# Patient Record
Sex: Male | Born: 1990 | Race: White | Hispanic: No | Marital: Single | Smoking: Never smoker
Health system: Southern US, Community
[De-identification: ages and names within clinical notes are randomized; demographics above are authoritative.]

## PROBLEM LIST (undated history)

## (undated) HISTORY — PX: CHOLECYSTECTOMY: SHX55

## (undated) HISTORY — PX: GALLBLADDER SURGERY: SHX652

---
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
## 2008-04-19 ENCOUNTER — Emergency Department (HOSPITAL_BASED_OUTPATIENT_CLINIC_OR_DEPARTMENT_OTHER): Admission: EM | Admit: 2008-04-19 | Discharge: 2008-04-19 | Payer: Self-pay | Admitting: Emergency Medicine

## 2008-08-05 ENCOUNTER — Emergency Department (HOSPITAL_BASED_OUTPATIENT_CLINIC_OR_DEPARTMENT_OTHER): Admission: EM | Admit: 2008-08-05 | Discharge: 2008-08-05 | Payer: Self-pay | Admitting: Emergency Medicine

## 2008-08-07 ENCOUNTER — Emergency Department (HOSPITAL_BASED_OUTPATIENT_CLINIC_OR_DEPARTMENT_OTHER): Admission: EM | Admit: 2008-08-07 | Discharge: 2008-08-07 | Payer: Self-pay | Admitting: Emergency Medicine

## 2008-08-09 ENCOUNTER — Emergency Department (HOSPITAL_BASED_OUTPATIENT_CLINIC_OR_DEPARTMENT_OTHER): Admission: EM | Admit: 2008-08-09 | Discharge: 2008-08-09 | Payer: Self-pay | Admitting: Emergency Medicine

## 2008-09-08 ENCOUNTER — Emergency Department (HOSPITAL_BASED_OUTPATIENT_CLINIC_OR_DEPARTMENT_OTHER): Admission: EM | Admit: 2008-09-08 | Discharge: 2008-09-08 | Payer: Self-pay | Admitting: Emergency Medicine

## 2008-09-08 ENCOUNTER — Ambulatory Visit: Payer: Self-pay | Admitting: Diagnostic Radiology

## 2008-10-08 ENCOUNTER — Emergency Department (HOSPITAL_BASED_OUTPATIENT_CLINIC_OR_DEPARTMENT_OTHER): Admission: EM | Admit: 2008-10-08 | Discharge: 2008-10-08 | Payer: Self-pay | Admitting: Emergency Medicine

## 2008-10-28 ENCOUNTER — Emergency Department (HOSPITAL_BASED_OUTPATIENT_CLINIC_OR_DEPARTMENT_OTHER): Admission: EM | Admit: 2008-10-28 | Discharge: 2008-10-28 | Payer: Self-pay | Admitting: Emergency Medicine

## 2008-10-28 ENCOUNTER — Ambulatory Visit: Payer: Self-pay | Admitting: Occupational Medicine

## 2008-12-03 ENCOUNTER — Emergency Department (HOSPITAL_BASED_OUTPATIENT_CLINIC_OR_DEPARTMENT_OTHER): Admission: EM | Admit: 2008-12-03 | Discharge: 2008-12-03 | Payer: Self-pay | Admitting: Emergency Medicine

## 2008-12-03 ENCOUNTER — Ambulatory Visit: Payer: Self-pay | Admitting: Interventional Radiology

## 2009-04-28 ENCOUNTER — Emergency Department (HOSPITAL_BASED_OUTPATIENT_CLINIC_OR_DEPARTMENT_OTHER): Admission: EM | Admit: 2009-04-28 | Discharge: 2009-04-28 | Payer: Self-pay | Admitting: Emergency Medicine

## 2009-04-28 ENCOUNTER — Ambulatory Visit: Payer: Self-pay | Admitting: Interventional Radiology

## 2009-08-09 ENCOUNTER — Emergency Department (HOSPITAL_COMMUNITY): Admission: EM | Admit: 2009-08-09 | Discharge: 2009-08-09 | Payer: Self-pay | Admitting: Emergency Medicine

## 2010-05-03 IMAGING — CR DG CHEST 2V
2 series · 2 of 2 positions shown · non-contrast
Comparison: None.

CLINICAL DATA: Nausea and vomiting.  Abdominal pain.  Negative CT
abdomen pelvis earlier today.

CHEST - 2 VIEW 04/28/2009:

[w chest pa]
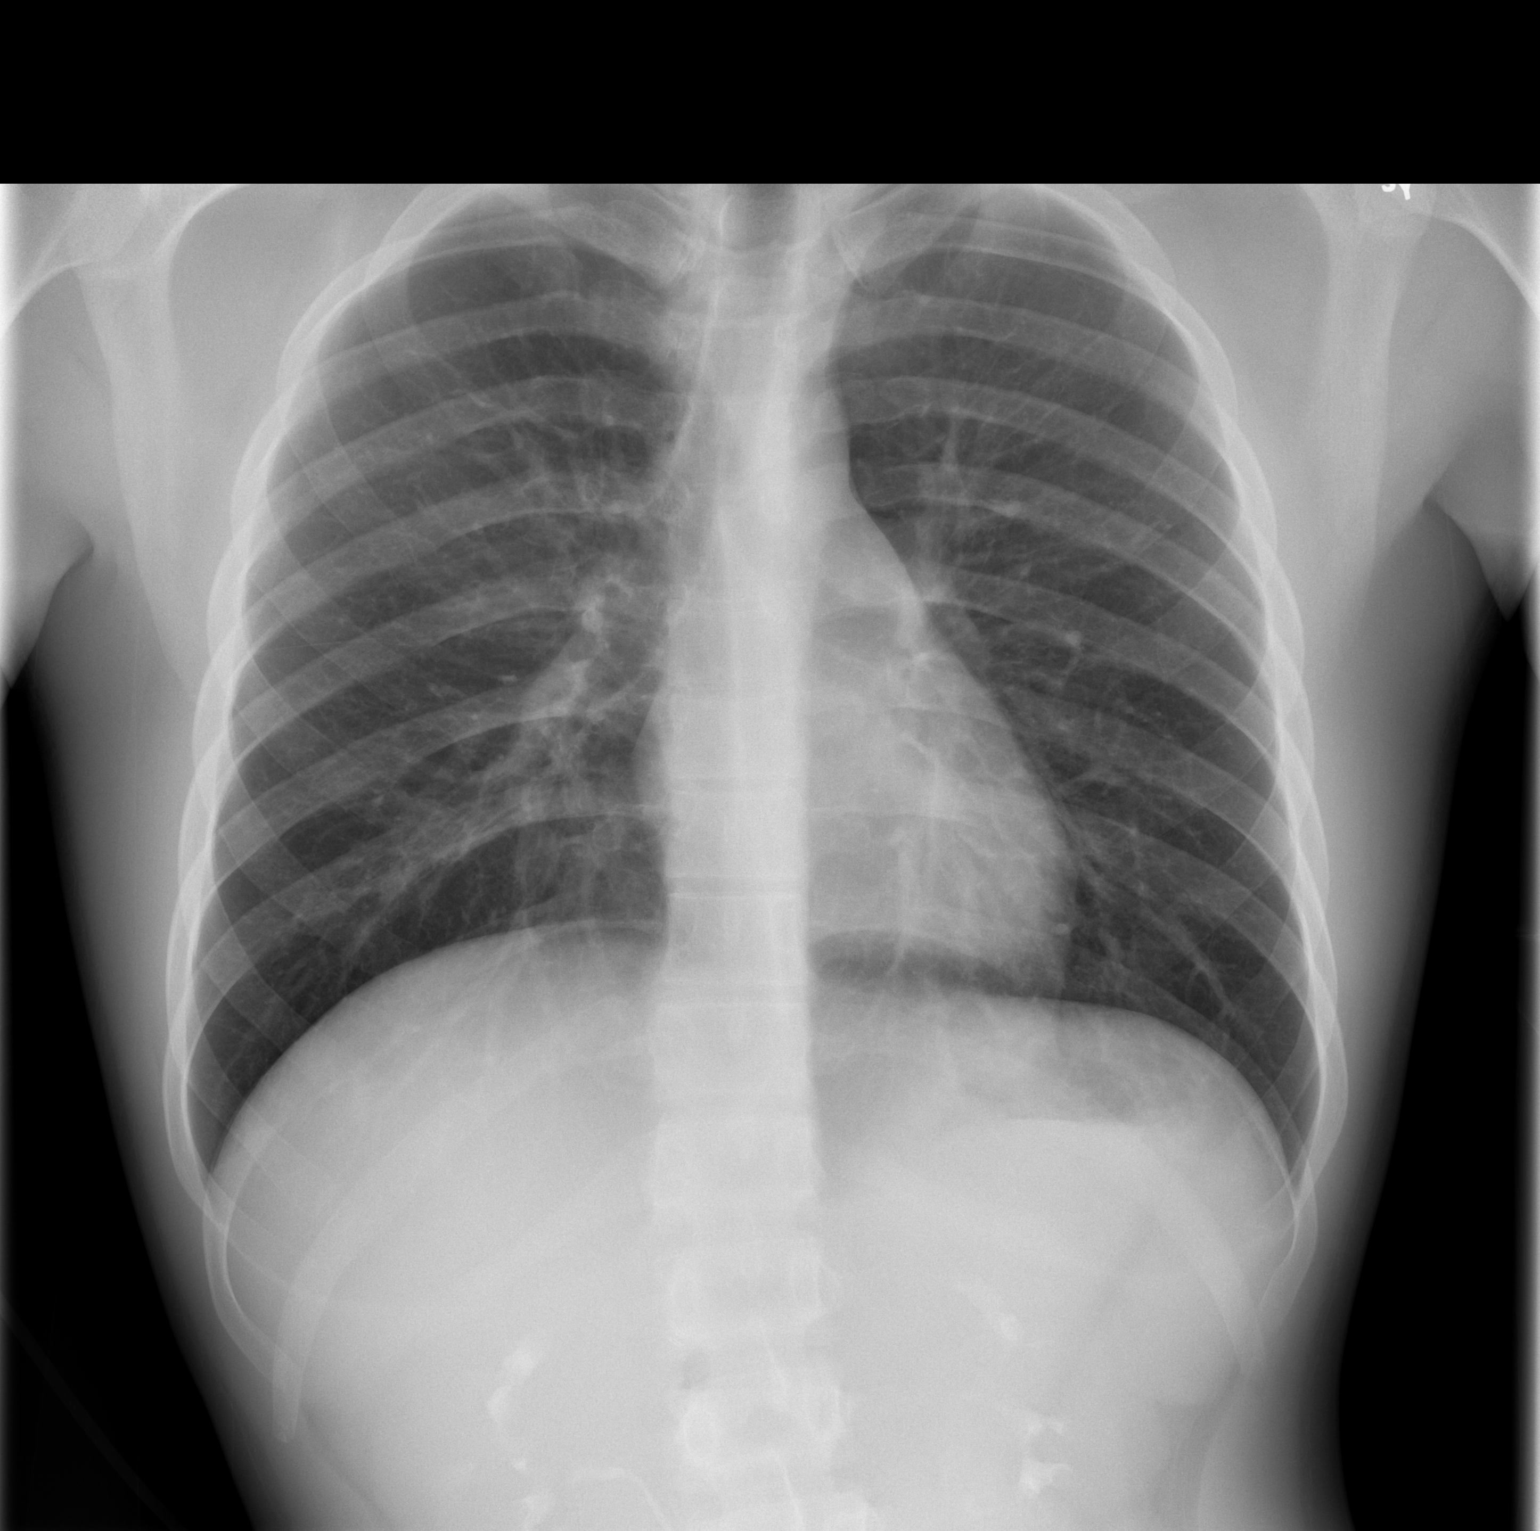

[w chest lat]
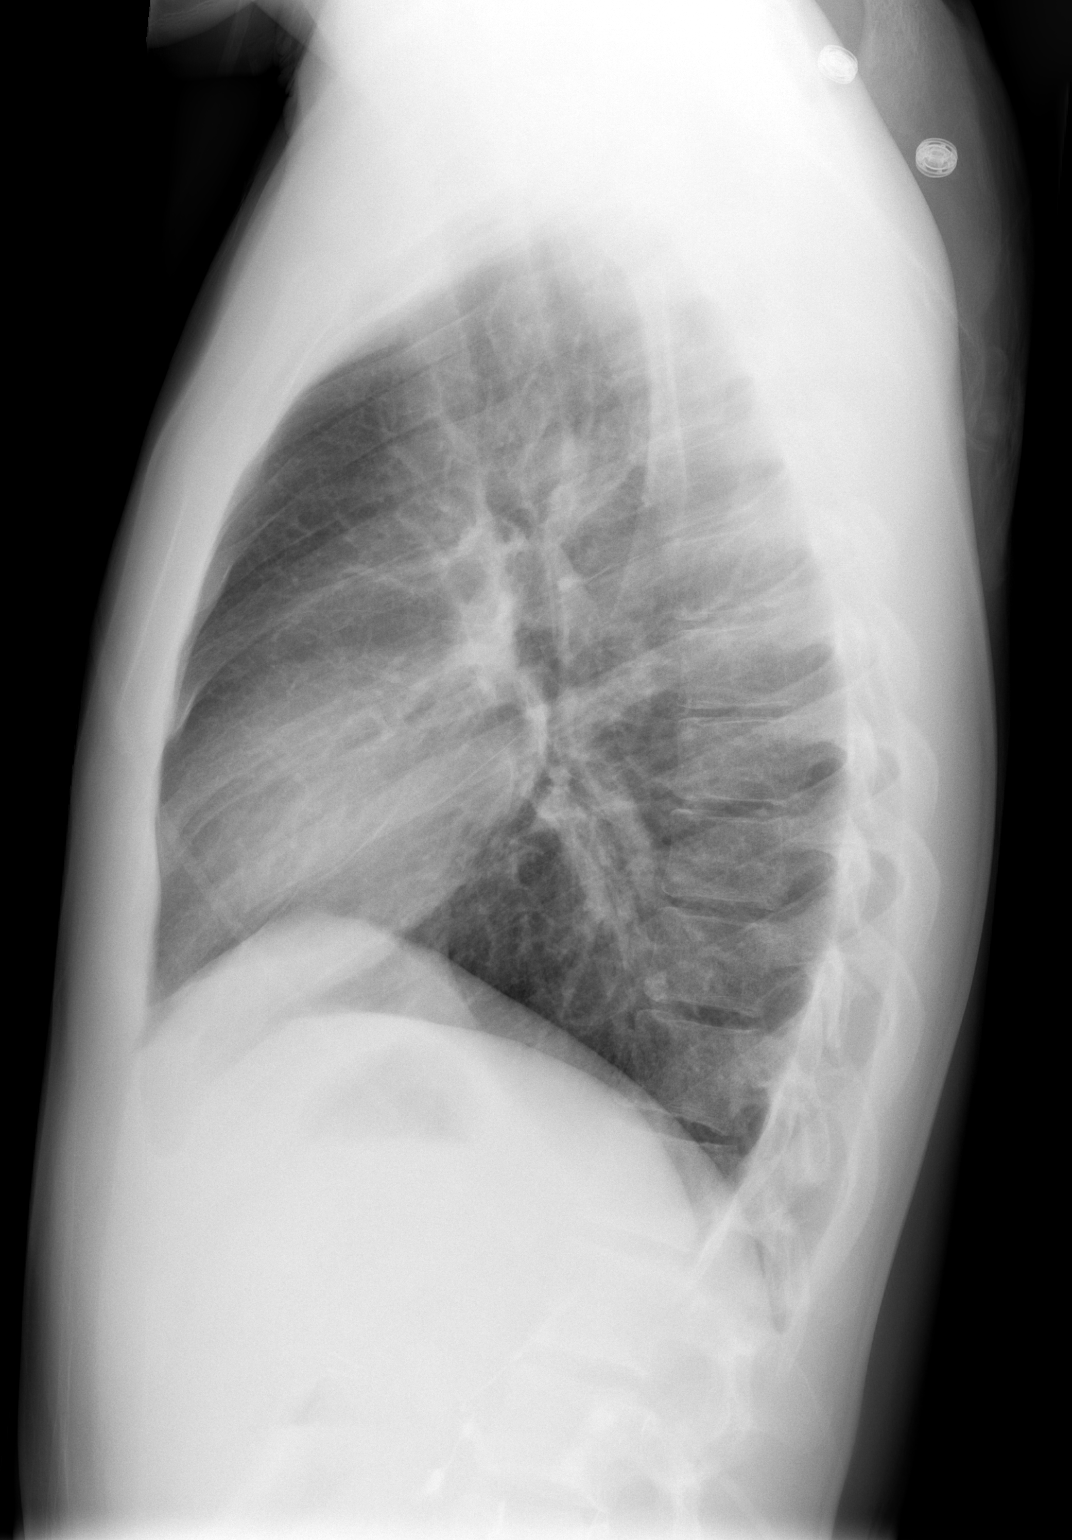

[2 of 2 positions shown; findings below may reference images not displayed]

FINDINGS: Cardiomediastinal silhouette unremarkable.  Lungs clear.
Bronchovascular markings normal.  Pulmonary vascularity normal.  No
pleural effusions.  No pneumothorax.  Visualized bony thorax
intact. Contrast material is present within the renal collecting
systems which are nondilated.
IMPRESSION: Normal chest.

## 2010-05-03 IMAGING — CT CT ABDOMEN W/ CM
2 of 4 series · 16 of 46 positions shown, 18 images · IV contrast (APPLIED)
Comparison: None

CT ABDOMEN

CLINICAL DATA: Abdominal pain

CT ABDOMEN AND PELVIS WITH CONTRAST
TECHNIQUE: Multidetector CT imaging of the abdomen and pelvis was
performed using the standard protocol following bolus
administration of intravenous contrast.
Contrast: 100 ml [id number]

[Series 2: abd/pelvis 5.0 b31f · axial · 0.67mm/px · z∈[+922,+1327]mm · 13 of 89 slices shown, 15 images]
[im 4/89  soft-tissue]
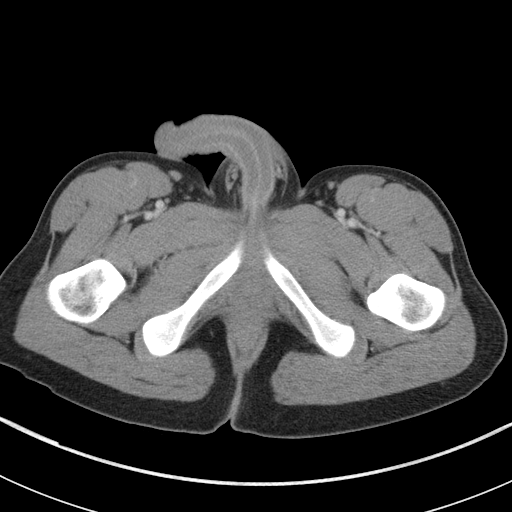
[im 4/89  bone]
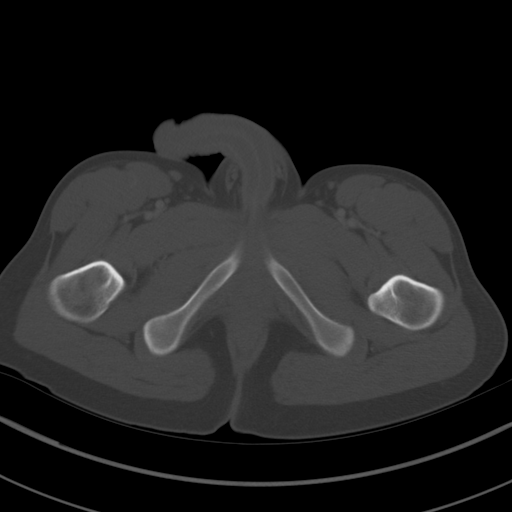
[im 12/89  soft-tissue]
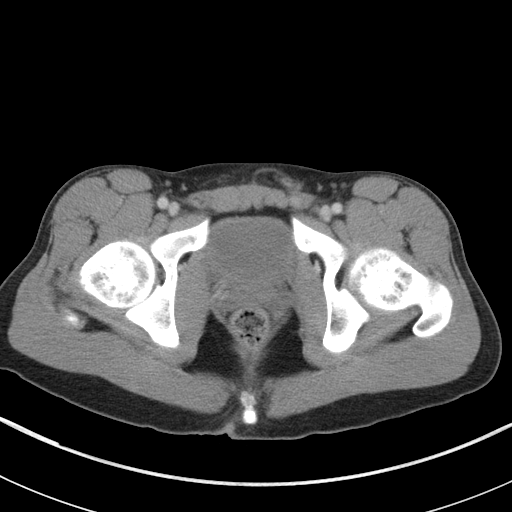
[im 19/89  soft-tissue]
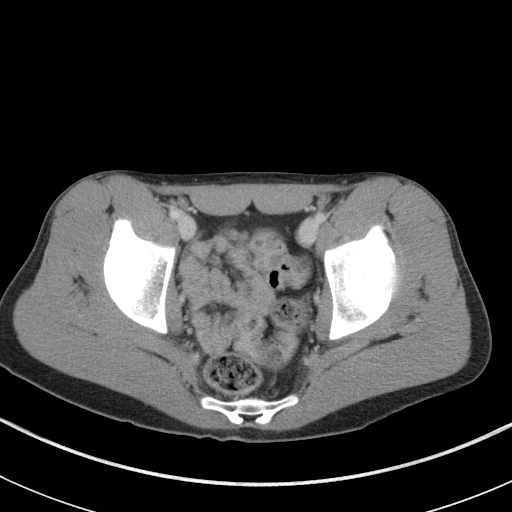
[im 26/89  soft-tissue]
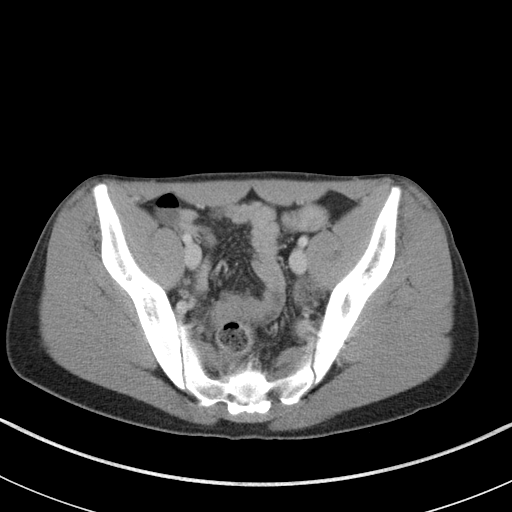
[im 30/89  soft-tissue]
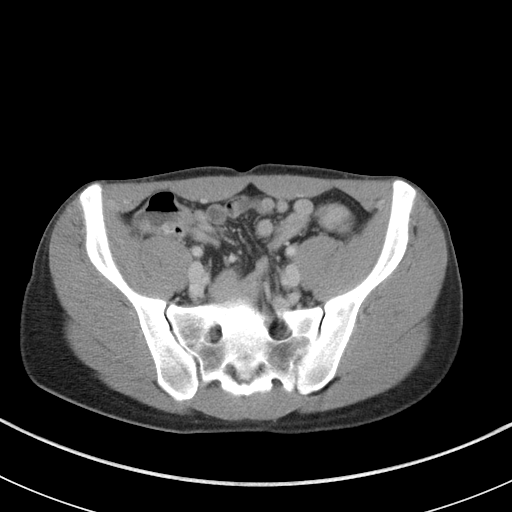
[im 37/89  soft-tissue]
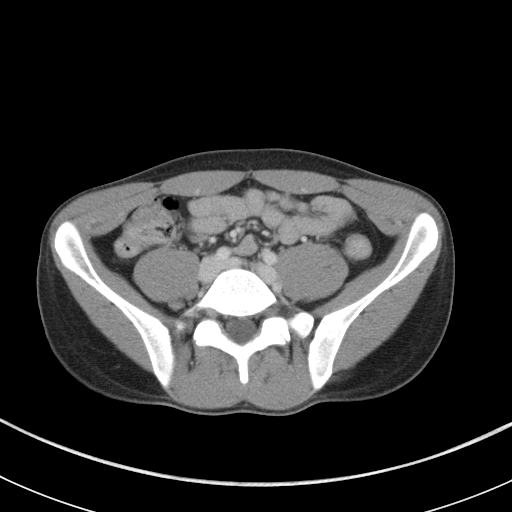
[im 45/89  soft-tissue]
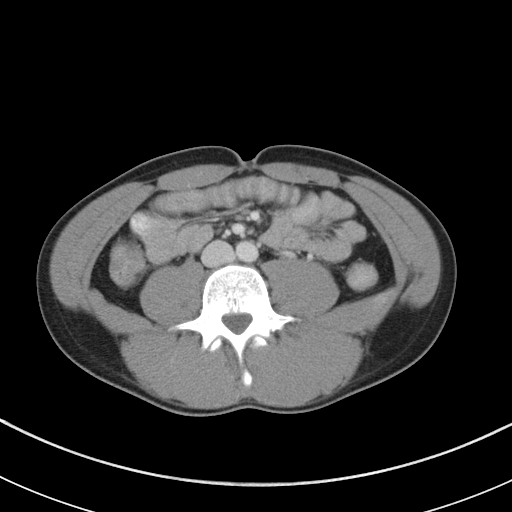
[im 52/89  soft-tissue]
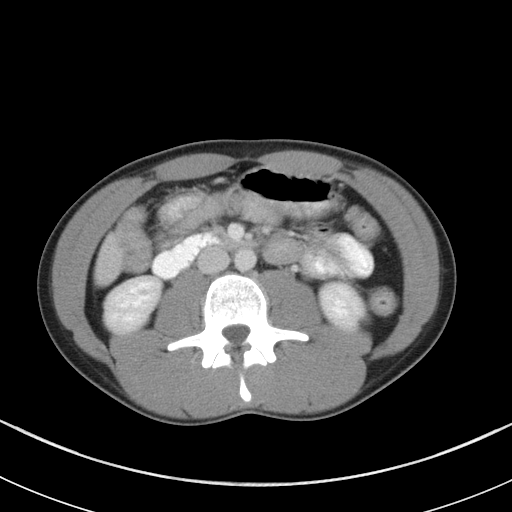
[im 59/89  soft-tissue]
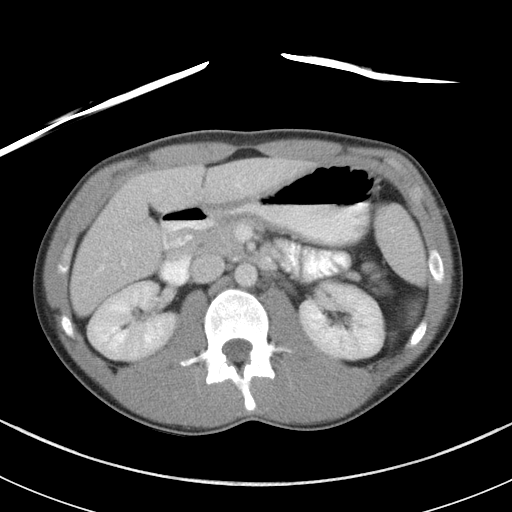
[im 59/89  bone]
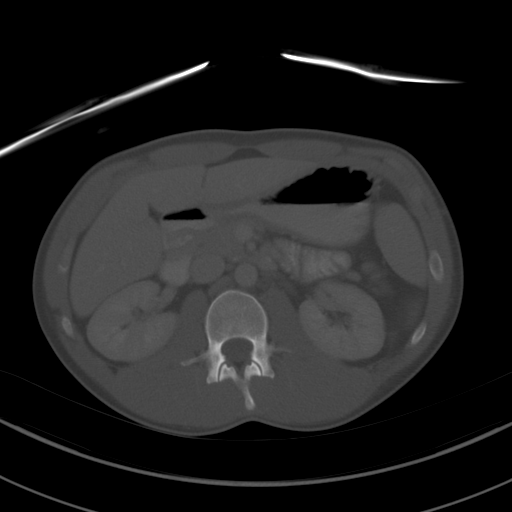
[im 63/89  soft-tissue]
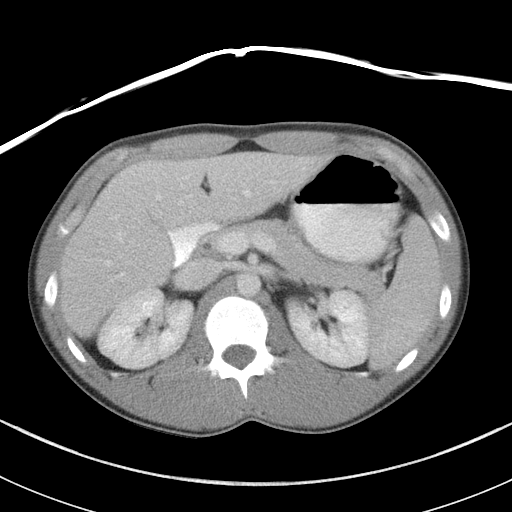
[im 70/89  soft-tissue]
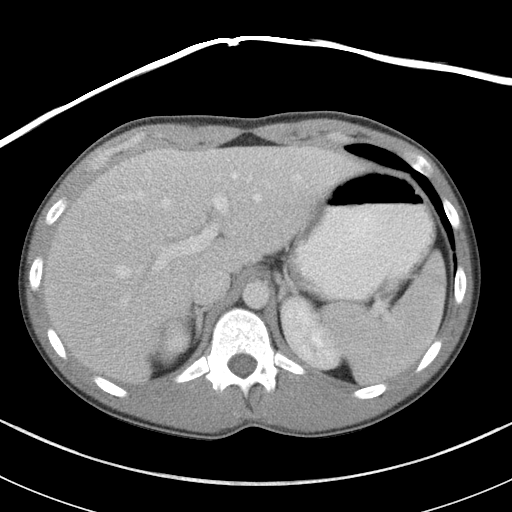
[im 78/89  soft-tissue]
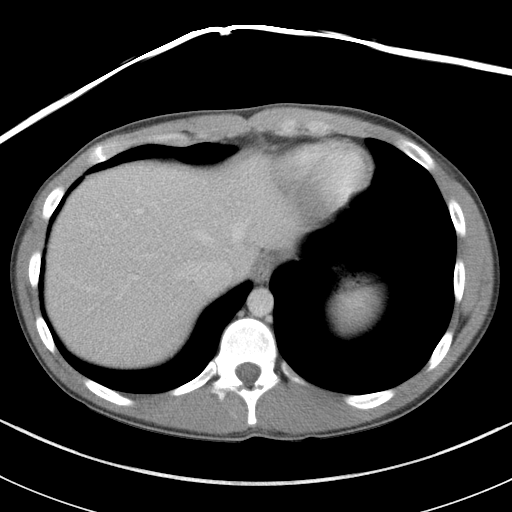
[im 85/89  soft-tissue]
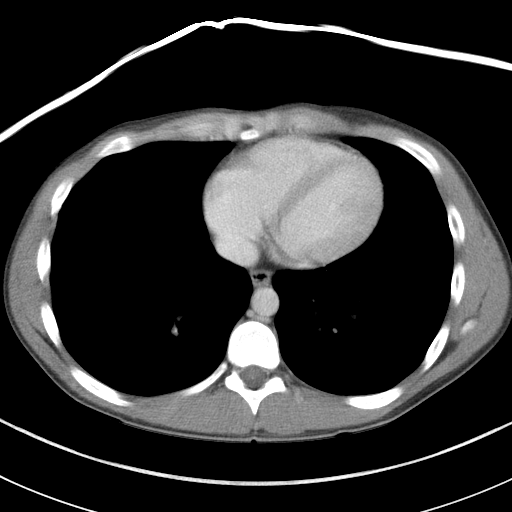

[Series 5: abd/pelvis 3.0 coronal · coronal · 0.74mm/px · 3 of 72 slices shown]
[im 24/72  soft-tissue]
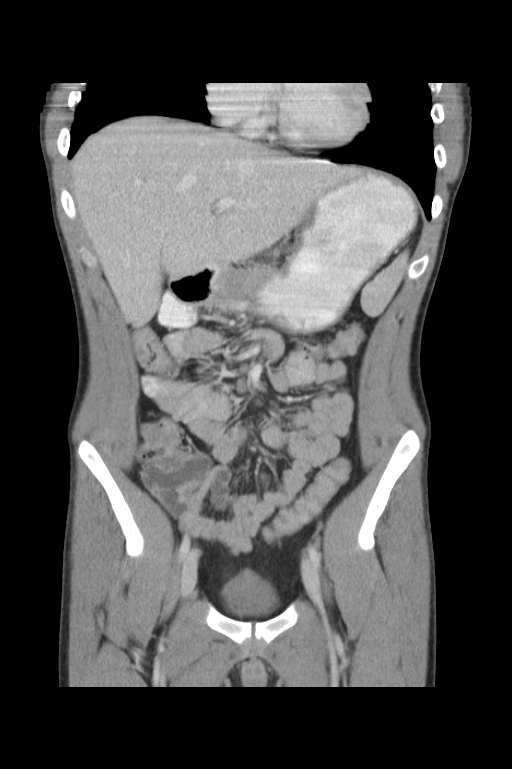
[im 32/72  soft-tissue]
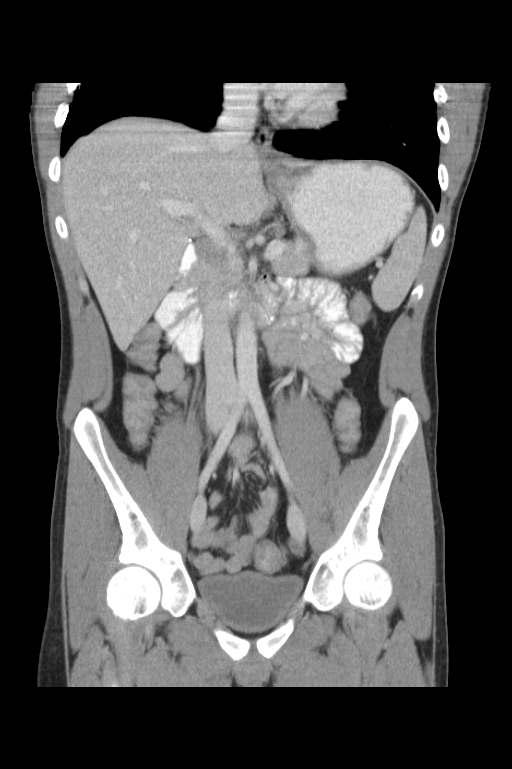
[im 40/72  soft-tissue]
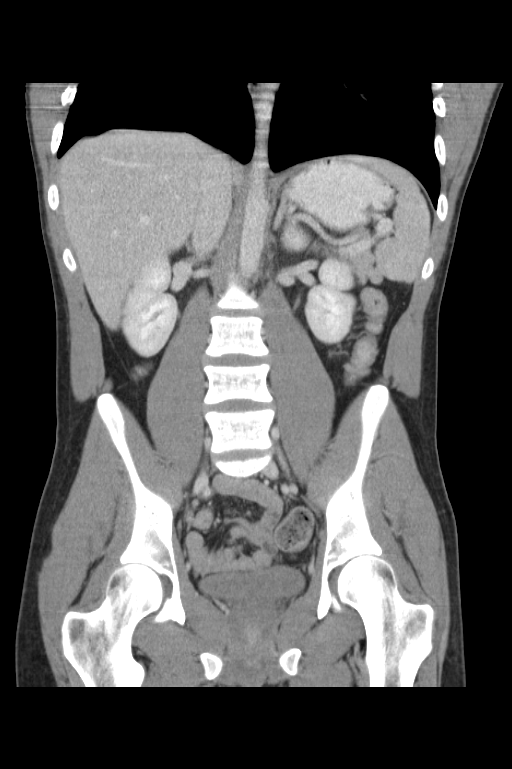

[16 of 46 positions shown; findings below may reference images not displayed]

FINDINGS: Liver, spleen, pancreas, adrenal glands, and kidneys are
within normal limits.  Post cholecystectomy.  No free fluid. Small
gastrohepatic ligament nodes.
IMPRESSION: No acute intra-abdominal pathology.

CT PELVIS
FINDINGS: Normal appendix.  The bladder and prostate are
unremarkable.  No free fluid.
IMPRESSION: No acute intrapelvic pathology.

## 2010-12-20 LAB — CBC
HCT: 45.1 % (ref 39.0–52.0)
MCV: 89.2 fL (ref 78.0–100.0)
Platelets: 245 10*3/uL (ref 150–400)
RDW: 13.4 % (ref 11.5–15.5)
WBC: 21.5 10*3/uL — ABNORMAL HIGH (ref 4.0–10.5)

## 2010-12-20 LAB — COMPREHENSIVE METABOLIC PANEL
BUN: 17 mg/dL (ref 6–23)
CO2: 23 mEq/L (ref 19–32)
Calcium: 11.1 mg/dL — ABNORMAL HIGH (ref 8.4–10.5)
Chloride: 104 mEq/L (ref 96–112)
Creatinine, Ser: 0.8 mg/dL (ref 0.4–1.5)
GFR calc non Af Amer: >60 mL/min (ref >60)
Glucose, Bld: 175 mg/dL — ABNORMAL HIGH (ref 70–99)
Potassium: 3.4 mEq/L — ABNORMAL LOW (ref 3.5–5.1)
Total Bilirubin: 0.8 mg/dL (ref 0.3–1.2)

## 2010-12-20 LAB — DIFFERENTIAL
Basophils Absolute: 0 10*3/uL (ref 0.0–0.1)
Basophils Relative: 0 % (ref 0–1)
Lymphocytes Relative: 6 % — ABNORMAL LOW (ref 12–46)
Monocytes Relative: 8 % (ref 3–12)
Neutro Abs: 18.5 10*3/uL — ABNORMAL HIGH (ref 1.7–7.7)

## 2010-12-29 LAB — ETHANOL: Alcohol, Ethyl (B): <10 mg/dL (ref 0–10)

## 2010-12-29 LAB — URINALYSIS, ROUTINE W REFLEX MICROSCOPIC
Glucose, UA: NEGATIVE mg/dL
Ketones, ur: 15 mg/dL — AB
Protein, ur: 100 mg/dL — AB
Specific Gravity, Urine: 1.019 (ref 1.005–1.030)

## 2010-12-29 LAB — DIFFERENTIAL
Eosinophils Relative: 0 % (ref 0–5)
Lymphocytes Relative: 3 % — ABNORMAL LOW (ref 12–46)
Lymphs Abs: 0.4 10*3/uL — ABNORMAL LOW (ref 0.7–4.0)
Neutro Abs: 12.3 10*3/uL — ABNORMAL HIGH (ref 1.7–7.7)

## 2010-12-29 LAB — POCT TOXICOLOGY PANEL: Opiates: POSITIVE

## 2010-12-29 LAB — BASIC METABOLIC PANEL
CO2: 24 mEq/L (ref 19–32)
GFR calc non Af Amer: >60 mL/min (ref >60)
Sodium: 138 mEq/L (ref 135–145)

## 2010-12-29 LAB — CBC
HCT: 39.5 % (ref 39.0–52.0)
Hemoglobin: 13.5 g/dL (ref 13.0–17.0)
MCHC: 34.3 g/dL (ref 30.0–36.0)
MCV: 90.1 fL (ref 78.0–100.0)
Platelets: 165 10*3/uL (ref 150–400)
RBC: 4.39 MIL/uL (ref 4.22–5.81)
WBC: 13.8 10*3/uL — ABNORMAL HIGH (ref 4.0–10.5)

## 2010-12-29 LAB — URINE MICROSCOPIC-ADD ON

## 2010-12-30 LAB — LIPASE, BLOOD: Lipase: 43 U/L (ref 23–300)

## 2010-12-30 LAB — BASIC METABOLIC PANEL: GFR calc non Af Amer: >60 mL/min (ref >60)

## 2011-01-27 ENCOUNTER — Emergency Department (HOSPITAL_COMMUNITY)
Admission: EM | Admit: 2011-01-27 | Discharge: 2011-01-27 | Disposition: A | Discharge status: Home / Self Care | Payer: PRIVATE HEALTH INSURANCE | Attending: Emergency Medicine | Admitting: Emergency Medicine

## 2011-01-27 DIAGNOSIS — F19939 Other psychoactive substance use, unspecified with withdrawal, unspecified: Secondary | ICD-10-CM | POA: Insufficient documentation

## 2011-01-27 DIAGNOSIS — R1115 Cyclical vomiting syndrome unrelated to migraine: Secondary | ICD-10-CM | POA: Insufficient documentation

## 2011-01-27 DIAGNOSIS — F1120 Opioid dependence, uncomplicated: Secondary | ICD-10-CM | POA: Insufficient documentation

## 2011-01-27 DIAGNOSIS — Z79899 Other long term (current) drug therapy: Secondary | ICD-10-CM | POA: Insufficient documentation

## 2011-06-12 LAB — COMPREHENSIVE METABOLIC PANEL
AST: 58 — ABNORMAL HIGH
Albumin: 5
BUN: 13
CO2: 20
Calcium: 10.2
Chloride: 104
Creatinine, Ser: 0.9
GFR calc non Af Amer: >60
Glucose, Bld: 102 — ABNORMAL HIGH
Potassium: 3.3 — ABNORMAL LOW
Total Bilirubin: 0.8
Total Protein: 7.8

## 2011-06-12 LAB — CBC
HCT: 38.9 — ABNORMAL LOW
Hemoglobin: 13.3
MCV: 90.1
Platelets: 234
RDW: 12.4

## 2011-06-12 LAB — DIFFERENTIAL
Lymphocytes Relative: 16
Monocytes Absolute: 1.4 — ABNORMAL HIGH
Monocytes Relative: 10
Neutrophils Relative %: 74

## 2011-06-12 LAB — URINALYSIS, ROUTINE W REFLEX MICROSCOPIC: Glucose, UA: NEGATIVE

## 2011-06-16 LAB — COMPREHENSIVE METABOLIC PANEL
Alkaline Phosphatase: 99
BUN: 10
CO2: 17 — ABNORMAL LOW
GFR calc non Af Amer: >60
Glucose, Bld: 102 — ABNORMAL HIGH
Potassium: 4.2
Total Protein: 9.2 — ABNORMAL HIGH

## 2011-06-16 LAB — POCT TOXICOLOGY PANEL: Tetrahydrocannabinol: POSITIVE

## 2011-06-16 LAB — CBC
HCT: 44.2
Hemoglobin: 14.9
MCHC: 33.6
RBC: 4.94
RDW: 12.2

## 2011-06-16 LAB — DIFFERENTIAL
Basophils Absolute: 0
Basophils Relative: 0
Eosinophils Absolute: 0
Monocytes Relative: 3
Neutro Abs: 11.9 — ABNORMAL HIGH
Neutrophils Relative %: 89 — ABNORMAL HIGH

## 2011-06-16 LAB — BASIC METABOLIC PANEL
BUN: 7
BUN: 9
CO2: 17 — ABNORMAL LOW
Chloride: 104
Chloride: 106
Creatinine, Ser: 0.7
Glucose, Bld: 96
Potassium: 3.3 — ABNORMAL LOW

## 2011-06-16 LAB — ETHANOL: Alcohol, Ethyl (B): <10

## 2011-06-16 LAB — LIPASE, BLOOD: Lipase: 69

## 2011-06-19 LAB — DIFFERENTIAL
Basophils Absolute: 0.1 10*3/uL (ref 0.0–0.1)
Basophils Relative: 1 % (ref 0–1)
Eosinophils Relative: 1 % (ref 0–5)
Lymphocytes Relative: 21 % (ref 12–46)
Monocytes Absolute: 0.4 10*3/uL (ref 0.1–1.0)
Neutro Abs: 6.7 10*3/uL (ref 1.7–7.7)

## 2011-06-19 LAB — COMPREHENSIVE METABOLIC PANEL
Alkaline Phosphatase: 93 U/L (ref 39–117)
BUN: 16 mg/dL (ref 6–23)
CO2: 20 mEq/L (ref 19–32)
Chloride: 108 mEq/L (ref 96–112)
Creatinine, Ser: 0.8 mg/dL (ref 0.4–1.5)
GFR calc non Af Amer: >60 mL/min (ref >60)
Glucose, Bld: 104 mg/dL — ABNORMAL HIGH (ref 70–99)
Potassium: 3.1 mEq/L — ABNORMAL LOW (ref 3.5–5.1)
Total Bilirubin: 1.3 mg/dL — ABNORMAL HIGH (ref 0.3–1.2)

## 2011-06-19 LAB — URINALYSIS, ROUTINE W REFLEX MICROSCOPIC
Bilirubin Urine: NEGATIVE
Hgb urine dipstick: NEGATIVE
Nitrite: NEGATIVE
Protein, ur: 100 mg/dL — AB
Specific Gravity, Urine: 1.029 (ref 1.005–1.030)
Urobilinogen, UA: 0.2 mg/dL (ref 0.0–1.0)

## 2011-06-19 LAB — URINE MICROSCOPIC-ADD ON

## 2011-06-19 LAB — CBC
HCT: 41.2 % (ref 39.0–52.0)
Hemoglobin: 14.2 g/dL (ref 13.0–17.0)
MCV: 90.8 fL (ref 78.0–100.0)
RBC: 4.54 MIL/uL (ref 4.22–5.81)
WBC: 9.2 10*3/uL (ref 4.0–10.5)

## 2011-06-19 LAB — LIPASE, BLOOD: Lipase: 63 U/L (ref 23–300)

## 2012-08-24 ENCOUNTER — Ambulatory Visit (INDEPENDENT_AMBULATORY_CARE_PROVIDER_SITE_OTHER): Payer: Self-pay | Admitting: Physician Assistant

## 2012-08-24 ENCOUNTER — Encounter: Payer: Self-pay | Admitting: Physician Assistant

## 2012-08-24 VITALS — BP 147/78 | HR 79 | Ht 68.0 in | Wt 168.0 lb

## 2012-08-24 DIAGNOSIS — Z131 Encounter for screening for diabetes mellitus: Secondary | ICD-10-CM

## 2012-08-24 DIAGNOSIS — R030 Elevated blood-pressure reading, without diagnosis of hypertension: Secondary | ICD-10-CM

## 2012-08-24 DIAGNOSIS — Z13220 Encounter for screening for lipoid disorders: Secondary | ICD-10-CM

## 2012-08-24 DIAGNOSIS — M5136 Other intervertebral disc degeneration, lumbar region: Secondary | ICD-10-CM | POA: Insufficient documentation

## 2012-08-24 DIAGNOSIS — M545 Low back pain: Secondary | ICD-10-CM

## 2012-08-24 MED ORDER — MELOXICAM 7.5 MG PO TABS: 7.5000 mg | ORAL_TABLET | Freq: Every day | ORAL | Status: DC

## 2012-08-24 MED ORDER — GABAPENTIN 300 MG PO CAPS: ORAL_CAPSULE | ORAL | Status: DC

## 2012-08-24 MED ORDER — TRAMADOL HCL 50 MG PO TABS: 50.0000 mg | ORAL_TABLET | Freq: Three times a day (TID) | ORAL | Status: DC | PRN
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
## 2012-08-24 NOTE — Patient Instructions (Addendum)
Call with information about Pain Clinic referral.   Try Mobic daily and Tramadol for breakthrough.

## 2012-08-26 ENCOUNTER — Telehealth: Payer: Self-pay | Admitting: Physician Assistant

## 2012-08-26 DIAGNOSIS — T1490XA Injury, unspecified, initial encounter: Secondary | ICD-10-CM

## 2012-08-26 DIAGNOSIS — M545 Low back pain: Secondary | ICD-10-CM

## 2012-08-26 NOTE — Telephone Encounter (Signed)
Pt called and states that he was letting you know he wanted to see [facility] in [place] and their FAX is [phone] and their P# is [phone] and I did not see Pain Referral as he said you and him had discussed? Thanks, [employer]

## 2012-09-21 ENCOUNTER — Telehealth: Payer: Self-pay | Admitting: *Deleted

## 2012-09-21 NOTE — Telephone Encounter (Signed)
Ok good to know. Will look for communication with progress with them.

## 2012-09-21 NOTE — Telephone Encounter (Signed)
Pt called & stated that you wanted the pain clinic phone number where he is being seen.  Number is [phone]. Not sure what this is about.  Look in chart review & look at Jennifer's last phone note that never got routed to you.

## 2012-12-19 ENCOUNTER — Other Ambulatory Visit: Payer: Self-pay | Admitting: Physician Assistant

## 2013-03-29 ENCOUNTER — Ambulatory Visit (INDEPENDENT_AMBULATORY_CARE_PROVIDER_SITE_OTHER): Payer: 59 | Admitting: Sports Medicine

## 2013-03-29 ENCOUNTER — Encounter: Payer: Self-pay | Admitting: Sports Medicine

## 2013-03-29 VITALS — BP 119/74 | HR 69 | Wt 147.0 lb

## 2013-03-29 DIAGNOSIS — M5137 Other intervertebral disc degeneration, lumbosacral region: Secondary | ICD-10-CM

## 2013-03-29 DIAGNOSIS — M5136 Other intervertebral disc degeneration, lumbar region: Secondary | ICD-10-CM

## 2013-03-29 DIAGNOSIS — M51379 Other intervertebral disc degeneration, lumbosacral region without mention of lumbar back pain or lower extremity pain: Secondary | ICD-10-CM

## 2013-03-29 DIAGNOSIS — F329 Major depressive disorder, single episode, unspecified: Secondary | ICD-10-CM

## 2013-03-29 DIAGNOSIS — M51369 Other intervertebral disc degeneration, lumbar region without mention of lumbar back pain or lower extremity pain: Secondary | ICD-10-CM

## 2013-03-29 MED ORDER — TRAMADOL-ACETAMINOPHEN 37.5-325 MG PO TABS: 1.0000 | ORAL_TABLET | Freq: Four times a day (QID) | ORAL | Status: DC | PRN

## 2013-03-29 MED ORDER — AMITRIPTYLINE HCL 50 MG PO TABS: ORAL_TABLET | ORAL | Status: DC
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
## 2013-03-29 NOTE — Progress Notes (Deleted)
   Subjective:    I'm seeing this patient as a consultation for:    CC:   HPI:  Past medical history, Surgical history, Family history not pertinant except as noted below, Social history, Allergies, and medications have been entered into the medical record, reviewed, and no changes needed.   Review of Systems: No headache, visual changes, nausea, vomiting, diarrhea, constipation, dizziness, abdominal pain, skin rash, fevers, chills, night sweats, weight loss, swollen lymph nodes, body aches, joint swelling, muscle aches, chest pain, shortness of breath, mood changes, visual or auditory hallucinations.   Objective:   General: Well Developed, well nourished, and in no acute distress.  Neuro/Psych: Alert and oriented x3, extra-ocular muscles intact, able to move all 4 extremities, sensation grossly intact. Skin: Warm and dry, no rashes noted.  Respiratory: Not using accessory muscles, speaking in full sentences, trachea midline.  Cardiovascular: Pulses palpable, no extremity edema. Abdomen: Does not appear distended.   Impression and Recommendations:   This case required medical decision making of moderate complexity.  

## 2013-03-29 NOTE — Assessment & Plan Note (Signed)
I do not have an MRI though based on my interpretation of the CT scan, he has spinal stenosis with degenerative disc disease at the L4-L5 level, and mild degenerative disc disease at the L5-S1 level. He is currently being set up for a pain clinic. He does have concurrent depression which I will initiate treatment with with a tricyclic antidepressant, and he will continue with his primary care provider. On going to treat him in the meantime without narcotics. Ultracet, amitriptyline taper, home exercises. Return to see me in about 4 weeks to see how things are going.

## 2013-03-29 NOTE — Assessment & Plan Note (Signed)
With poor sleep, poor appetite, poor concentration, poor mood, lack of interest, he does meet criteria for major depressive disorder and this is certainly compensating treatment of his low back pain.  I am starting amitriptyline for its effect on low back pain, pinched nerves, appetite, and depression. He will continue depression treatment with his primary care provider.

## 2013-03-30 ENCOUNTER — Other Ambulatory Visit: Payer: Self-pay | Admitting: Sports Medicine

## 2013-03-30 DIAGNOSIS — M5136 Other intervertebral disc degeneration, lumbar region: Secondary | ICD-10-CM

## 2013-03-30 DIAGNOSIS — M51369 Other intervertebral disc degeneration, lumbar region without mention of lumbar back pain or lower extremity pain: Secondary | ICD-10-CM

## 2013-03-30 MED ORDER — NAPROXEN 500 MG PO TABS: 500.0000 mg | ORAL_TABLET | Freq: Two times a day (BID) | ORAL | Status: DC

## 2013-05-03 ENCOUNTER — Telehealth: Payer: Self-pay | Admitting: Physician Assistant

## 2013-05-03 NOTE — Telephone Encounter (Signed)
Pt called and states that he wants another pain referral to [facility] and does NOT WANT TO GO TO [facility]. He wants [place] to call him about this at [phone] or his fiancee- [phone] or his mom's number [phone]. Thanks, [employer]

## 2013-05-03 NOTE — Telephone Encounter (Signed)
Ok for pain clinic referral at [facility].

## 2013-05-04 ENCOUNTER — Other Ambulatory Visit: Payer: Self-pay | Admitting: Physician Assistant

## 2013-05-04 DIAGNOSIS — M5136 Other intervertebral disc degeneration, lumbar region: Secondary | ICD-10-CM

## 2013-05-04 NOTE — Telephone Encounter (Signed)
Jade,  Please re-enter a pain referral for this patient. The old referral has timed out, I guess and disappeared from the referrals. Thanks. Victorino Dike

## 2013-05-04 NOTE — Telephone Encounter (Signed)
Ok done

## 2013-05-18 ENCOUNTER — Other Ambulatory Visit: Payer: Self-pay | Admitting: Physician Assistant

## 2013-05-19 NOTE — Telephone Encounter (Signed)
Called cell number and disconnected. Called mother who is emergency contact and LM with her to have pt call us. Barry Dienes, LPN

## 2013-05-19 NOTE — Telephone Encounter (Signed)
Call pt and see if still taking medication. Unclear on med list.

## 2013-05-22 NOTE — Telephone Encounter (Signed)
Called moms number again and LM with her for a return call.  Pt number has been disconnected. Barry Dienes, LPN

## 2013-05-23 ENCOUNTER — Telehealth: Payer: Self-pay | Admitting: *Deleted

## 2013-05-23 MED ORDER — GABAPENTIN 300 MG PO CAPS: ORAL_CAPSULE | ORAL | Status: DC

## 2013-12-21 ENCOUNTER — Telehealth: Payer: Self-pay | Admitting: *Deleted

## 2013-12-21 NOTE — Telephone Encounter (Signed)
Pt called and stated that he would like a referral to the pain clinic in [place]. He was going to the pain clinic in [place] but because he had been incarcerated for the past year he was told by them that they could refer him to another pain clinic. He stated that he has not heard anything from them and is asking if Lesly RubensteinJade would place a referral for him to be seen at the pain clinic here in [place]. Deno Etienne.Negin Hegg L Ramirez Fullbright\

## 2013-12-22 NOTE — Telephone Encounter (Signed)
Pt needs appt. Not been seen in over a year.

## 2013-12-25 NOTE — Telephone Encounter (Signed)
Called and informed pt of recommendations.Travia Onstad L Cornellius Kropp  

## 2013-12-27 ENCOUNTER — Ambulatory Visit: Payer: 59 | Admitting: Physician Assistant

## 2013-12-27 DIAGNOSIS — Z0289 Encounter for other administrative examinations: Secondary | ICD-10-CM

## 2014-06-18 ENCOUNTER — Ambulatory Visit: Payer: 59 | Admitting: Physician Assistant

## 2014-06-19 ENCOUNTER — Ambulatory Visit: Payer: 59 | Admitting: Family Medicine

## 2014-06-20 ENCOUNTER — Encounter: Payer: Self-pay | Admitting: Physician Assistant

## 2014-06-20 ENCOUNTER — Ambulatory Visit (INDEPENDENT_AMBULATORY_CARE_PROVIDER_SITE_OTHER): Payer: 59 | Admitting: Physician Assistant

## 2014-06-20 VITALS — BP 106/61 | HR 93 | Ht 68.0 in | Wt 134.0 lb

## 2014-06-20 DIAGNOSIS — K219 Gastro-esophageal reflux disease without esophagitis: Secondary | ICD-10-CM

## 2014-06-20 DIAGNOSIS — R198 Other specified symptoms and signs involving the digestive system and abdomen: Secondary | ICD-10-CM

## 2014-06-20 DIAGNOSIS — R110 Nausea: Secondary | ICD-10-CM

## 2014-06-20 DIAGNOSIS — M5136 Other intervertebral disc degeneration, lumbar region: Secondary | ICD-10-CM

## 2014-06-20 DIAGNOSIS — M51369 Other intervertebral disc degeneration, lumbar region without mention of lumbar back pain or lower extremity pain: Secondary | ICD-10-CM

## 2014-06-20 MED ORDER — PANTOPRAZOLE SODIUM 40 MG PO TBEC: 40.0000 mg | DELAYED_RELEASE_TABLET | Freq: Every day | ORAL | Status: AC

## 2014-06-20 MED ORDER — HYDROCODONE-ACETAMINOPHEN 7.5-325 MG PO TABS: ORAL_TABLET | ORAL | Status: DC

## 2014-06-20 NOTE — Patient Instructions (Signed)

## 2014-06-21 LAB — CBC WITH DIFFERENTIAL/PLATELET
BASOS ABS: 0 10*3/uL (ref 0.0–0.1)
BASOS PCT: 0 % (ref 0–1)
EOS ABS: 0.1 10*3/uL (ref 0.0–0.7)
Eosinophils Relative: 2 % (ref 0–5)
HCT: 40 % (ref 39.0–52.0)
Hemoglobin: 13.2 g/dL (ref 13.0–17.0)
Lymphocytes Relative: 36 % (ref 12–46)
Lymphs Abs: 1.8 10*3/uL (ref 0.7–4.0)
MCH: 28.1 pg (ref 26.0–34.0)
MCHC: 33 g/dL (ref 30.0–36.0)
MCV: 85.3 fL (ref 78.0–100.0)
Monocytes Absolute: 0.5 10*3/uL (ref 0.1–1.0)
Monocytes Relative: 10 % (ref 3–12)
NEUTROS PCT: 52 % (ref 43–77)
Neutro Abs: 2.5 10*3/uL (ref 1.7–7.7)
PLATELETS: 239 10*3/uL (ref 150–400)
RBC: 4.69 MIL/uL (ref 4.22–5.81)
RDW: 13.5 % (ref 11.5–15.5)
WBC: 4.9 10*3/uL (ref 4.0–10.5)

## 2014-06-21 LAB — COMPLETE METABOLIC PANEL WITH GFR
ALK PHOS: 80 U/L (ref 39–117)
ALT: 24 U/L (ref 0–53)
AST: 26 U/L (ref 0–37)
Albumin: 4.5 g/dL (ref 3.5–5.2)
BILIRUBIN TOTAL: 0.5 mg/dL (ref 0.2–1.2)
BUN: 11 mg/dL (ref 6–23)
CO2: 28 mEq/L (ref 19–32)
Calcium: 9.4 mg/dL (ref 8.4–10.5)
Chloride: 106 mEq/L (ref 96–112)
Creat: 0.8 mg/dL (ref 0.50–1.35)
GFR, Est African American: >89 mL/min
GFR, Est Non African American: >89 mL/min
Glucose, Bld: 102 mg/dL — ABNORMAL HIGH (ref 70–99)
Potassium: 4.3 mEq/L (ref 3.5–5.3)
SODIUM: 141 meq/L (ref 135–145)
TOTAL PROTEIN: 7.8 g/dL (ref 6.0–8.3)

## 2014-06-21 LAB — LIPASE: Lipase: 16 U/L (ref 0–75)

## 2014-06-21 LAB — AMYLASE: AMYLASE: 30 U/L (ref 0–105)

## 2014-06-22 NOTE — Progress Notes (Signed)
   Subjective:    Patient ID: Richard Salas, male    DOB: [birth year], 23 y.o.   MRN: [id number]  HPI Pt presents to the clinic to follow up on GERD. Pt feels like omeprazole is not helping as it used too. He is getting more nauseated after eating than he used too.   He also discusses ongoing lumbar degenerative disc disease. He is trying to get into another pain clinic because head doctor would not given him any narcotics due to being in prison. His conviction was not drug related per pt. He works 12 hour days and in a lot of pain. Tramadol and tylenol don't help.    Review of Systems  All other systems reviewed and are negative.      Objective:   Physical Exam  Constitutional: He is oriented to person, place, and time. He appears well-developed and well-nourished.  HENT:  Head: Normocephalic and atraumatic.  Pulmonary/Chest: Effort normal and breath sounds normal. He has no wheezes.  Abdominal: Bowel sounds are normal.  Pt had some guarding over LUQ and LLQ. No rebound. He would say it was pain but felt different.   Neurological: He is alert and oriented to person, place, and time.  Skin: Skin is dry.  Psychiatric: He has a normal mood and affect. His behavior is normal.          Assessment & Plan:  Left lower abdominal guarding- will get labs to further evaluation. No compliants from pt today. Cmp, cbc, lipase, amylase ordered. If pain changes or worsens follow up for CT scan.   GERD- switched to protonix.   Lumbar degenerative disc disease- placed another pain clinic referral. Discussed will give one month. Pain clinic will either take over but no more narcotics for ongoing pain. More than welcome to see Dr. Karie Schwalbe here in office again as well. Stay away from NSAIDs. Discussed PT.

## 2014-08-22 ENCOUNTER — Encounter: Payer: Self-pay | Admitting: Physician Assistant

## 2014-08-22 ENCOUNTER — Ambulatory Visit (INDEPENDENT_AMBULATORY_CARE_PROVIDER_SITE_OTHER): Payer: 59 | Admitting: Physician Assistant

## 2014-08-22 VITALS — BP 126/74 | HR 99 | Ht 68.0 in | Wt 148.0 lb

## 2014-08-22 DIAGNOSIS — S2231XD Fracture of one rib, right side, subsequent encounter for fracture with routine healing: Secondary | ICD-10-CM

## 2014-08-22 DIAGNOSIS — S2231XA Fracture of one rib, right side, initial encounter for closed fracture: Secondary | ICD-10-CM | POA: Insufficient documentation

## 2014-08-22 MED ORDER — OXYCODONE HCL 10 MG PO TABS: ORAL_TABLET | ORAL | Status: AC

## 2014-08-22 MED ORDER — CYCLOBENZAPRINE HCL 10 MG PO TABS: 10.0000 mg | ORAL_TABLET | Freq: Three times a day (TID) | ORAL | Status: DC | PRN

## 2014-08-22 MED ORDER — CYCLOBENZAPRINE HCL 10 MG PO TABS: 10.0000 mg | ORAL_TABLET | Freq: Three times a day (TID) | ORAL | Status: AC | PRN
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
## 2014-10-29 ENCOUNTER — Ambulatory Visit: Payer: 59 | Admitting: Physician Assistant

## 2014-11-21 ENCOUNTER — Telehealth: Payer: Self-pay | Admitting: Physician Assistant

## 2014-11-21 NOTE — Telephone Encounter (Signed)
Ok to make pain clinic referral.

## 2014-11-21 NOTE — Telephone Encounter (Signed)
Patient's mother called stated that he needs another referral to a pain clinic sent to [facility] in [place] before March 24th. They have already called that office to ask but since its been so long he needs another referral sent to their office. Patient is againg out of his insurance and was told he will have one more month before he is completely off and it will be very expensive for the first visit. Thanks

## 2014-11-29 ENCOUNTER — Telehealth: Payer: Self-pay | Admitting: *Deleted

## 2014-11-29 DIAGNOSIS — G894 Chronic pain syndrome: Secondary | ICD-10-CM

## 2014-11-29 NOTE — Telephone Encounter (Signed)
Referral placed.
